# Patient Record
Sex: Male | Born: 1990 | Race: Black or African American | Hispanic: No | Marital: Single | Smoking: Current every day smoker
Health system: Southern US, Community
[De-identification: ages and names within clinical notes are randomized; demographics above are authoritative.]

---

## 2014-11-12 ENCOUNTER — Encounter (HOSPITAL_BASED_OUTPATIENT_CLINIC_OR_DEPARTMENT_OTHER): Payer: Self-pay | Admitting: *Deleted

## 2014-11-12 ENCOUNTER — Emergency Department (HOSPITAL_BASED_OUTPATIENT_CLINIC_OR_DEPARTMENT_OTHER)
Admission: EM | Admit: 2014-11-12 | Discharge: 2014-11-12 | Disposition: A | Discharge status: Home / Self Care | Payer: 59 | Attending: Emergency Medicine | Admitting: Emergency Medicine

## 2014-11-12 DIAGNOSIS — Z720 Tobacco use: Secondary | ICD-10-CM | POA: Insufficient documentation

## 2014-11-12 DIAGNOSIS — Z8619 Personal history of other infectious and parasitic diseases: Secondary | ICD-10-CM | POA: Diagnosis not present

## 2014-11-12 DIAGNOSIS — Z202 Contact with and (suspected) exposure to infections with a predominantly sexual mode of transmission: Secondary | ICD-10-CM | POA: Insufficient documentation

## 2014-11-12 MED ORDER — METRONIDAZOLE 500 MG PO TABS: 500.0000 mg | ORAL_TABLET | Freq: Two times a day (BID) | ORAL | Status: AC

## 2014-11-12 MED ORDER — AZITHROMYCIN 250 MG PO TABS
1000.0000 mg | ORAL_TABLET | Freq: Once | ORAL | Status: AC
  Administered 2014-11-12: 1000 mg via ORAL
  Filled 2014-11-12: qty 4

## 2014-11-12 MED ORDER — CEFTRIAXONE SODIUM 250 MG IJ SOLR
250.0000 mg | Freq: Once | INTRAMUSCULAR | Status: AC
  Administered 2014-11-12: 250 mg via INTRAMUSCULAR
  Filled 2014-11-12: qty 250

## 2014-11-12 NOTE — ED Notes (Signed)
MD at bedside. 

## 2014-11-12 NOTE — ED Provider Notes (Signed)
CSN: [id number]     Arrival date & time 11/12/14  [id number] History   First MD Initiated Contact with Patient 11/12/14 0932     Chief Complaint  Patient presents with  . Exposure to STD     (Consider location/radiation/quality/duration/timing/severity/associated sxs/prior Treatment) HPI Comments: Patient presents wanting to be treated for STDs. He states his male partner was treated for trichomonas about 2 days ago. He's currently denying any symptoms. He denies any penile discharge. He denies any urinary symptoms. He denies abdominal pain. He is sexually active and does not regularly use condoms.  Patient is a 24 y.o. male presenting with STD exposure.  Exposure to STD Pertinent negatives include no chest pain, no abdominal pain, no headaches and no shortness of breath.    History reviewed. No pertinent past medical history. History reviewed. No pertinent past surgical history. No family history on file. History  Substance Use Topics  . Smoking status: Current Every Day Smoker  . Smokeless tobacco: Not on file  . Alcohol Use: No    Review of Systems  Constitutional: Negative for fever, chills, diaphoresis and fatigue.  HENT: Negative for congestion, rhinorrhea and sneezing.   Eyes: Negative.   Respiratory: Negative for cough, chest tightness and shortness of breath.   Cardiovascular: Negative for chest pain and leg swelling.  Gastrointestinal: Negative for nausea, vomiting, abdominal pain, diarrhea and blood in stool.  Genitourinary: Negative for frequency, hematuria, flank pain, discharge, scrotal swelling, difficulty urinating, penile pain and testicular pain.  Musculoskeletal: Negative for back pain and arthralgias.  Skin: Negative for rash.  Neurological: Negative for dizziness, speech difficulty, weakness, numbness and headaches.      Allergies  Review of patient's allergies indicates no known allergies.  Home Medications   Prior to Admission medications   Not on  File   BP 142/86 mmHg  Pulse 69  Temp(Src) 98.3 F (36.8 C) (Oral)  Resp 16  Ht 5\' 4"  (1.626 m)  Wt 150 lb (68.04 kg)  BMI 25.73 kg/m2  SpO2 100% Physical Exam  Constitutional: He is oriented to person, place, and time. He appears well-developed and well-nourished.  HENT:  Head: Normocephalic and atraumatic.  Eyes: Pupils are equal, round, and reactive to light.  Neck: Normal range of motion. Neck supple.  Cardiovascular: Normal rate, regular rhythm and normal heart sounds.   Pulmonary/Chest: Effort normal and breath sounds normal. No respiratory distress. He has no wheezes. He has no rales. He exhibits no tenderness.  Abdominal: Soft. Bowel sounds are normal. There is no tenderness. There is no rebound and no guarding.  Genitourinary: Penis normal.  No penile discharge, no lesions or sores  Musculoskeletal: Normal range of motion. He exhibits no edema.  Lymphadenopathy:    He has no cervical adenopathy.  Neurological: He is alert and oriented to person, place, and time.  Skin: Skin is warm and dry. No rash noted.  Psychiatric: He has a normal mood and affect.    ED Course  Procedures (including critical care time) Labs Review Labs Reviewed  HIV ANTIBODY (ROUTINE TESTING)  RPR    Imaging Review No results found.   EKG Interpretation None      MDM   Final diagnoses:  STD exposure    Patient was treated presumptively with Rocephin and Zithromax. He was given a prescription for Flagyl. He was also tested for HIV and syphilis. Urged to follow-up with the health department or return here for any ongoing concerns.    Rolan BuccoMelanie Ramil Edgington, MD 11/12/14  1017 

## 2014-11-12 NOTE — Discharge Instructions (Signed)
Safe Sex °Safe sex is about reducing the risk of giving or getting a sexually transmitted disease (STD). STDs are spread through sexual contact involving the genitals, mouth, or rectum. Some STDs can be cured and others cannot. Safe sex can also prevent unintended pregnancies.  °WHAT ARE SOME SAFE SEX PRACTICES? °· Limit your sexual activity to only one partner who is having sex with only you. °· Talk to your partner about his or her past partners, past STDs, and drug use. °· Use a condom every time you have sexual intercourse. This includes vaginal, oral, and anal sexual activity. Both females and males should wear condoms during oral sex. Only use latex or polyurethane condoms and water-based lubricants. Using petroleum-based lubricants or oils to lubricate a condom will weaken the condom and increase the chance that it will break. The condom should be in place from the beginning to the end of sexual activity. Wearing a condom reduces, but does not completely eliminate, your risk of getting or giving an STD. STDs can be spread by contact with infected body fluids and skin. °· Get vaccinated for hepatitis B and HPV. °· Avoid alcohol and recreational drugs, which can affect your judgment. You may forget to use a condom or participate in high-risk sex. °· For females, avoid douching after sexual intercourse. Douching can spread an infection farther into the reproductive tract. °· Check your body for signs of sores, blisters, rashes, or unusual discharge. See your health care provider if you notice any of these signs. °· Avoid sexual contact if you have symptoms of an infection or are being treated for an STD. If you or your partner has herpes, avoid sexual contact when blisters are present. Use condoms at all other times. °· If you are at risk of being infected with HIV, it is recommended that you take a prescription medicine daily to prevent HIV infection. This is called pre-exposure prophylaxis (PrEP). You are  considered at risk if: °· You are a man who has sex with other men (MSM). °· You are a heterosexual man or woman who is sexually active with more than one partner. °· You take drugs by injection. °· You are sexually active with a partner who has HIV. °· Talk with your health care provider about whether you are at high risk of being infected with HIV. If you choose to begin PrEP, you should first be tested for HIV. You should then be tested every 3 months for as long as you are taking PrEP. °· See your health care provider for regular screenings, exams, and tests for other STDs. Before having sex with a new partner, each of you should be screened for STDs and should talk about the results with each other. °WHAT ARE THE BENEFITS OF SAFE SEX?  °· There is less chance of getting or giving an STD. °· You can prevent unwanted or unintended pregnancies. °· By discussing safe sex concerns with your partner, you may increase feelings of intimacy, comfort, trust, and honesty between the two of you. °Document Released: 09/13/2004 Document Revised: 12/21/2013 Document Reviewed: 01/28/2012 °ExitCare® Patient Information ©2015 [employer]. This information is not intended to replace advice given to you by your health care provider. Make sure you discuss any questions you have with your health care provider. ° °Trichomoniasis °Trichomoniasis is an infection caused by an organism called Trichomonas. The infection can affect both women and men. In women, the outer male genitalia and the vagina are affected. In men, the penis is   mainly affected, but the prostate and other reproductive organs can also be involved. Trichomoniasis is a sexually transmitted infection (STI) and is most often passed to another person through sexual contact.  RISK FACTORS  Having unprotected sexual intercourse.  Having sexual intercourse with an infected partner. SIGNS AND SYMPTOMS  Symptoms of trichomoniasis in women include:  Abnormal  gray-green frothy vaginal discharge.  Itching and irritation of the vagina.  Itching and irritation of the area outside the vagina. Symptoms of trichomoniasis in men include:   Penile discharge with or without pain.  Pain during urination. This results from inflammation of the urethra. DIAGNOSIS  Trichomoniasis may be found during a Pap test or physical exam. Your health care provider may use one of the following methods to help diagnose this infection:  Examining vaginal discharge under a microscope. For men, urethral discharge would be examined.  Testing the pH of the vagina with a test tape.  Using a vaginal swab test that checks for the Trichomonas organism. A test is available that provides results within a few minutes.  Doing a culture test for the organism. This is not usually needed. TREATMENT   You may be given medicine to fight the infection. Women should inform their health care provider if they could be or are pregnant. Some medicines used to treat the infection should not be taken during pregnancy.  Your health care provider may recommend over-the-counter medicines or creams to decrease itching or irritation.  Your sexual partner will need to be treated if infected. HOME CARE INSTRUCTIONS   Take medicines only as directed by your health care provider.  Take over-the-counter medicine for itching or irritation as directed by your health care provider.  Do not have sexual intercourse while you have the infection.  Women should not douche or wear tampons while they have the infection.  Discuss your infection with your partner. Your partner may have gotten the infection from you, or you may have gotten it from your partner.  Have your sex partner get examined and treated if necessary.  Practice safe, informed, and protected sex.  See your health care provider for other STI testing. SEEK MEDICAL CARE IF:   You still have symptoms after you finish your  medicine.  You develop abdominal pain.  You have pain when you urinate.  You have bleeding after sexual intercourse.  You develop a rash.  Your medicine makes you sick or makes you throw up (vomit). MAKE SURE YOU:  Understand these instructions.  Will watch your condition.  Will get help right away if you are not doing well or get worse. Document Released: 01/30/2001 Document Revised: 12/21/2013 Document Reviewed: 05/18/2013 [facility] Patient Information 2015 [employer], [place]. This information is not intended to replace advice given to you by your health care provider. Make sure you discuss any questions you have with your health care provider.

## 2014-11-12 NOTE — ED Notes (Signed)
Pt's partner tested positive for trichomonas a couple of days ago. Pt denies any symptoms but would like to be tested/tx.

## 2014-11-13 LAB — RPR: RPR Ser Ql: NONREACTIVE

## 2014-11-13 LAB — HIV ANTIBODY (ROUTINE TESTING W REFLEX): HIV SCREEN 4TH GENERATION: NONREACTIVE

## 2014-12-23 ENCOUNTER — Encounter (HOSPITAL_BASED_OUTPATIENT_CLINIC_OR_DEPARTMENT_OTHER): Payer: Self-pay | Admitting: *Deleted

## 2014-12-23 ENCOUNTER — Emergency Department (HOSPITAL_BASED_OUTPATIENT_CLINIC_OR_DEPARTMENT_OTHER)
Admission: EM | Admit: 2014-12-23 | Discharge: 2014-12-23 | Disposition: A | Discharge status: Home / Self Care | Payer: 59 | Attending: Emergency Medicine | Admitting: Emergency Medicine

## 2014-12-23 DIAGNOSIS — Z720 Tobacco use: Secondary | ICD-10-CM | POA: Insufficient documentation

## 2014-12-23 DIAGNOSIS — Y998 Other external cause status: Secondary | ICD-10-CM | POA: Insufficient documentation

## 2014-12-23 DIAGNOSIS — Y92410 Unspecified street and highway as the place of occurrence of the external cause: Secondary | ICD-10-CM | POA: Diagnosis not present

## 2014-12-23 DIAGNOSIS — Y9389 Activity, other specified: Secondary | ICD-10-CM | POA: Insufficient documentation

## 2014-12-23 DIAGNOSIS — S3991XA Unspecified injury of abdomen, initial encounter: Secondary | ICD-10-CM | POA: Insufficient documentation

## 2014-12-23 DIAGNOSIS — S161XXA Strain of muscle, fascia and tendon at neck level, initial encounter: Secondary | ICD-10-CM | POA: Insufficient documentation

## 2014-12-23 DIAGNOSIS — S199XXA Unspecified injury of neck, initial encounter: Secondary | ICD-10-CM | POA: Diagnosis present

## 2014-12-23 DIAGNOSIS — S39012A Strain of muscle, fascia and tendon of lower back, initial encounter: Secondary | ICD-10-CM | POA: Diagnosis not present

## 2014-12-23 MED ORDER — NAPROXEN 500 MG PO TABS: 500.0000 mg | ORAL_TABLET | Freq: Two times a day (BID) | ORAL | Status: AC
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
## 2014-12-23 NOTE — Discharge Instructions (Signed)
Expect to be sore and stiff for the next few days. Take the Naprosyn as directed. Work note provided to be out of work for 2 days.
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
## 2015-01-13 ENCOUNTER — Encounter (HOSPITAL_BASED_OUTPATIENT_CLINIC_OR_DEPARTMENT_OTHER): Payer: Self-pay | Admitting: *Deleted

## 2015-01-13 ENCOUNTER — Emergency Department (HOSPITAL_BASED_OUTPATIENT_CLINIC_OR_DEPARTMENT_OTHER)
Admission: EM | Admit: 2015-01-13 | Discharge: 2015-01-13 | Disposition: A | Discharge status: Home / Self Care | Payer: 59 | Attending: Emergency Medicine | Admitting: Emergency Medicine

## 2015-01-13 ENCOUNTER — Emergency Department (HOSPITAL_BASED_OUTPATIENT_CLINIC_OR_DEPARTMENT_OTHER): Payer: 59

## 2015-01-13 DIAGNOSIS — Z720 Tobacco use: Secondary | ICD-10-CM | POA: Insufficient documentation

## 2015-01-13 DIAGNOSIS — M545 Low back pain, unspecified: Secondary | ICD-10-CM

## 2015-01-13 DIAGNOSIS — Z791 Long term (current) use of non-steroidal anti-inflammatories (NSAID): Secondary | ICD-10-CM | POA: Insufficient documentation

## 2015-01-13 MED ORDER — CYCLOBENZAPRINE HCL 10 MG PO TABS: 10.0000 mg | ORAL_TABLET | Freq: Two times a day (BID) | ORAL | Status: AC | PRN

## 2015-01-13 MED ORDER — IBUPROFEN 800 MG PO TABS
800.0000 mg | ORAL_TABLET | Freq: Once | ORAL | Status: AC
  Administered 2015-01-13: 800 mg via ORAL
  Filled 2015-01-13: qty 1

## 2015-01-13 MED ORDER — IBUPROFEN 800 MG PO TABS: 800.0000 mg | ORAL_TABLET | Freq: Three times a day (TID) | ORAL | Status: AC
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
## 2015-01-13 NOTE — Discharge Instructions (Signed)
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
## 2015-11-26 ENCOUNTER — Emergency Department (HOSPITAL_BASED_OUTPATIENT_CLINIC_OR_DEPARTMENT_OTHER): Payer: No Typology Code available for payment source

## 2015-11-26 ENCOUNTER — Emergency Department (HOSPITAL_BASED_OUTPATIENT_CLINIC_OR_DEPARTMENT_OTHER)
Admission: EM | Admit: 2015-11-26 | Discharge: 2015-11-26 | Disposition: A | Discharge status: Home / Self Care | Payer: No Typology Code available for payment source | Attending: Emergency Medicine | Admitting: Emergency Medicine

## 2015-11-26 ENCOUNTER — Encounter (HOSPITAL_BASED_OUTPATIENT_CLINIC_OR_DEPARTMENT_OTHER): Payer: Self-pay | Admitting: *Deleted

## 2015-11-26 DIAGNOSIS — F17210 Nicotine dependence, cigarettes, uncomplicated: Secondary | ICD-10-CM | POA: Insufficient documentation

## 2015-11-26 DIAGNOSIS — J069 Acute upper respiratory infection, unspecified: Secondary | ICD-10-CM | POA: Insufficient documentation

## 2015-11-26 LAB — RAPID STREP SCREEN (MED CTR MEBANE ONLY): STREPTOCOCCUS, GROUP A SCREEN (DIRECT): NEGATIVE

## 2015-11-26 NOTE — ED Notes (Signed)
Patient c/o sore throat/productive cough since yesterday

## 2015-11-26 NOTE — Discharge Instructions (Signed)
Upper Respiratory Infection, Adult Most upper respiratory infections (URIs) are a viral infection of the air passages leading to the lungs. A URI affects the nose, throat, and upper air passages. The most common type of URI is nasopharyngitis and is typically referred to as "the common cold." URIs run their course and usually go away on their own. Most of the time, a URI does not require medical attention, but sometimes a bacterial infection in the upper airways can follow a viral infection. This is called a secondary infection. Sinus and middle ear infections are common types of secondary upper respiratory infections. Bacterial pneumonia can also complicate a URI. A URI can worsen asthma and chronic obstructive pulmonary disease (COPD). Sometimes, these complications can require emergency medical care and may be life threatening.  CAUSES Almost all URIs are caused by viruses. A virus is a type of germ and can spread from one person to another.  RISKS FACTORS You may be at risk for a URI if:   You smoke.   You have chronic heart or lung disease.  You have a weakened defense (immune) system.   You are very young or very old.   You have nasal allergies or asthma.  You work in crowded or poorly ventilated areas.  You work in health care facilities or schools. SIGNS AND SYMPTOMS  Symptoms typically develop 2-3 days after you come in contact with a cold virus. Most viral URIs last 7-10 days. However, viral URIs from the influenza virus (flu virus) can last 14-18 days and are typically more severe. Symptoms may include:   Runny or stuffy (congested) nose.   Sneezing.   Cough.   Sore throat.   Headache.   Fatigue.   Fever.   Loss of appetite.   Pain in your forehead, behind your eyes, and over your cheekbones (sinus pain).  Muscle aches.  DIAGNOSIS  Your health care provider may diagnose a URI by:  Physical exam.  Tests to check that your symptoms are not due to  another condition such as:  Strep throat.  Sinusitis.  Pneumonia.  Asthma. TREATMENT  A URI goes away on its own with time. It cannot be cured with medicines, but medicines may be prescribed or recommended to relieve symptoms. Medicines may help:  Reduce your fever.  Reduce your cough.  Relieve nasal congestion. HOME CARE INSTRUCTIONS   Take medicines only as directed by your health care provider.   Gargle warm saltwater or take cough drops to comfort your throat as directed by your health care provider.  Use a warm mist humidifier or inhale steam from a shower to increase air moisture. This may make it easier to breathe.  Drink enough fluid to keep your urine clear or pale yellow.   Eat soups and other clear broths and maintain good nutrition.   Rest as needed.   Return to work when your temperature has returned to normal or as your health care provider advises. You may need to stay home longer to avoid infecting others. You can also use a face mask and careful hand washing to prevent spread of the virus.  Increase the usage of your inhaler if you have asthma.   Do not use any tobacco products, including cigarettes, chewing tobacco, or electronic cigarettes. If you need help quitting, ask your health care provider. PREVENTION  The best way to protect yourself from getting a cold is to practice good hygiene.   Avoid oral or hand contact with people with cold   symptoms.   Wash your hands often if contact occurs.  There is no clear evidence that vitamin C, vitamin E, echinacea, or exercise reduces the chance of developing a cold. However, it is always recommended to get plenty of rest, exercise, and practice good nutrition.  SEEK MEDICAL CARE IF:   You are getting worse rather than better.   Your symptoms are not controlled by medicine.   You have chills.  You have worsening shortness of breath.  You have brown or red mucus.  You have yellow or brown nasal  discharge.  You have pain in your face, especially when you bend forward.  You have a fever.  You have swollen neck glands.  You have pain while swallowing.  You have white areas in the back of your throat. SEEK IMMEDIATE MEDICAL CARE IF:   You have severe or persistent:  Headache.  Ear pain.  Sinus pain.  Chest pain.  You have chronic lung disease and any of the following:  Wheezing.  Prolonged cough.  Coughing up blood.  A change in your usual mucus.  You have a stiff neck.  You have changes in your:  Vision.  Hearing.  Thinking.  Mood. MAKE SURE YOU:   Understand these instructions.  Will watch your condition.  Will get help right away if you are not doing well or get worse.   This information is not intended to replace advice given to you by your health care provider. Make sure you discuss any questions you have with your health care provider.   Document Released: 01/30/2001 Document Revised: 12/21/2014 Document Reviewed: 11/11/2013 Elsevier Interactive Patient Education 2016 [employer]  

## 2015-11-26 NOTE — ED Provider Notes (Signed)
CSN: [id number]     Arrival date & time 11/26/15  1216 History   First MD Initiated Contact with Patient 11/26/15 1330     Chief Complaint  Patient presents with  . Sore Throat  . Cough   HPI  Javier Jackson is a 25 year old male presenting with sore throat and cough. Onset of symptoms was yesterday. He is complaining of a cough productive of green-brown sputum. He states that he gets mildly short of breath after his coughing episodes but this resolves within 10-15 seconds. He denies associated chest pain. He is at his throat feels sore and sensation is only present when swallowing. He denies difficulty handling secretions, swallowing or breathing. He has not taken any over-the-counter medications. He denies known sick contacts. He states that he washed his hair yesterday and went out in the cold which he believes caused these symptoms. Denies fevers, chills, headaches, ear pain, ear fullness, nasal congestion, rhinorrhea, abdominal pain, nausea or vomiting.  History reviewed. No pertinent past medical history. No past surgical history on file. No family history on file. Social History  Substance Use Topics  . Smoking status: Current Every Day Smoker -- 0.50 packs/day    Types: Cigarettes  . Smokeless tobacco: None  . Alcohol Use: No    Review of Systems  All other systems reviewed and are negative.     Allergies  Review of patient's allergies indicates no known allergies.  Home Medications   Prior to Admission medications   Medication Sig Start Date End Date Taking? Authorizing Provider  cyclobenzaprine (FLEXERIL) 10 MG tablet Take 1 tablet (10 mg total) by mouth 2 (two) times daily as needed for muscle spasms. 01/13/15   Glynn OctaveStephen Rancour, MD  ibuprofen (ADVIL,MOTRIN) 800 MG tablet Take 1 tablet (800 mg total) by mouth 3 (three) times daily. 01/13/15   Glynn OctaveStephen Rancour, MD  metroNIDAZOLE (FLAGYL) 500 MG tablet Take 1 tablet (500 mg total) by mouth 2 (two) times daily. One po bid x 7  days 11/12/14   Rolan BuccoMelanie Belfi, MD  naproxen (NAPROSYN) 500 MG tablet Take 1 tablet (500 mg total) by mouth 2 (two) times daily. 12/23/14   Vanetta MuldersScott Zackowski, MD   BP 128/71 mmHg  Pulse 54  Temp(Src) 99.3 F (37.4 C) (Oral)  Resp 16  Ht 5\' 10"  (1.778 m)  Wt 58.968 kg  BMI 18.65 kg/m2  SpO2 100% Physical Exam  Constitutional: He appears well-developed and well-nourished. No distress.  Nontoxic-appearing  HENT:  Head: Normocephalic and atraumatic.  Right Ear: Tympanic membrane and ear canal normal.  Left Ear: Tympanic membrane and ear canal normal.  Nose: Nose normal. Right sinus exhibits no maxillary sinus tenderness and no frontal sinus tenderness. Left sinus exhibits no maxillary sinus tenderness and no frontal sinus tenderness.  Mouth/Throat: Uvula is midline, oropharynx is clear and moist and mucous membranes are normal. No oropharyngeal exudate, posterior oropharyngeal edema or posterior oropharyngeal erythema.  Eyes: Conjunctivae are normal. Right eye exhibits no discharge. Left eye exhibits no discharge. No scleral icterus.  Neck: Normal range of motion. Neck supple.  Cardiovascular: Normal rate, regular rhythm and normal heart sounds.   Pulmonary/Chest: Effort normal and breath sounds normal. No respiratory distress. He has no wheezes. He has no rales.  Abdominal: Soft. He exhibits no distension. There is no tenderness.  Musculoskeletal: Normal range of motion.  Lymphadenopathy:    He has no cervical adenopathy.  Neurological: He is alert. Coordination normal.  Skin: Skin is warm and dry.  Psychiatric: He has  a normal mood and affect. His behavior is normal.  Nursing note and vitals reviewed.   ED Course  Procedures (including critical care time) Labs Review Labs Reviewed  RAPID STREP SCREEN (NOT AT [facility])  CULTURE, GROUP A STREP [facility])    Imaging Review Dg Chest 2 View  11/26/2015  CLINICAL DATA:  25 year old presenting with 2 day history of cough, chest congestion and  sore throat. Current smoker. EXAM: CHEST  2 VIEW COMPARISON:  None. FINDINGS: Cardiomediastinal silhouette unremarkable. Lungs clear. Bronchovascular markings normal. Pulmonary vascularity normal. No pneumothorax. No pleural effusions. Visualized bony thorax intact. IMPRESSION: Normal examination. Electronically Signed   By: Hulan Saas M.D.   On: 11/26/2015 14:21   I have personally reviewed and evaluated these images and lab results as part of my medical decision-making.   EKG Interpretation None      MDM   Final diagnoses:  URI (upper respiratory infection)   Patient presenting with cough and sore throat x 1 day. VSS. Pt is nontoxic appearing. No nasal musosal edema noted. TMs pearly gray without erythema or effusion. Oropharynx without erythema, edema or exudate. Lungs CTAB. CXR negative for acute infiltrate. Rapid strep negative. Patients symptoms are consistent with URI, likely viral etiology. Discussed that antibiotics are not indicated for viral infections. Pt will be discharged with symptomatic treatment. Verbalizes understanding and is agreeable with plan. Pt is hemodynamically stable & in NAD prior to dc.     Rolm Gala Brinson Tozzi, PA-C 11/26/15 1435  Rolan Bucco, MD 11/26/15 1438

## 2015-11-27 LAB — CULTURE, GROUP A STREP (THRC)

## 2017-02-10 IMAGING — CR DG CHEST 2V
2 series · 2 of 2 positions shown · non-contrast
Comparison: None.

CLINICAL DATA: 25-year-old presenting with 2 day history of cough,
chest congestion and sore throat. Current smoker.

EXAM:
CHEST  2 VIEW

[w chest pa]
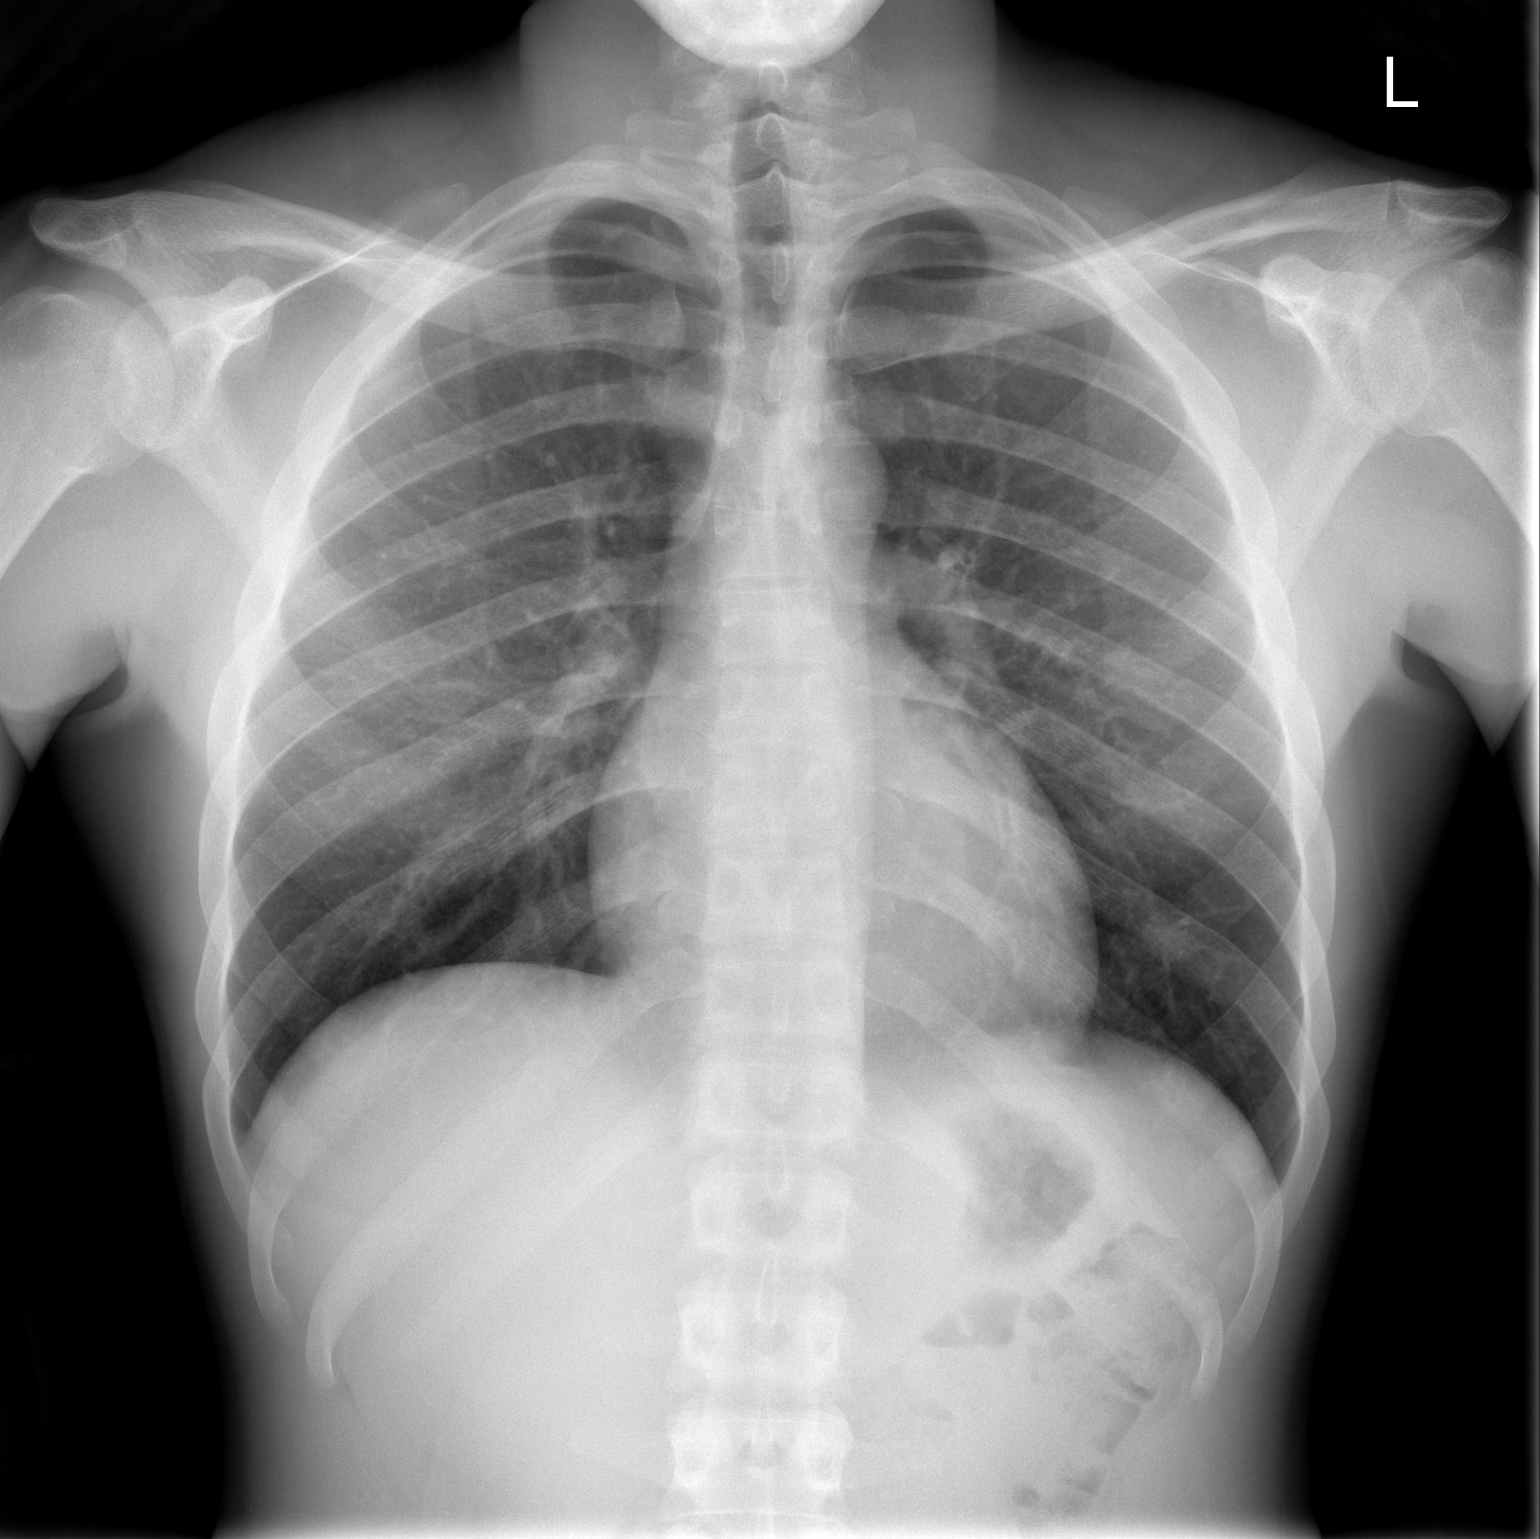

[w chest lat]
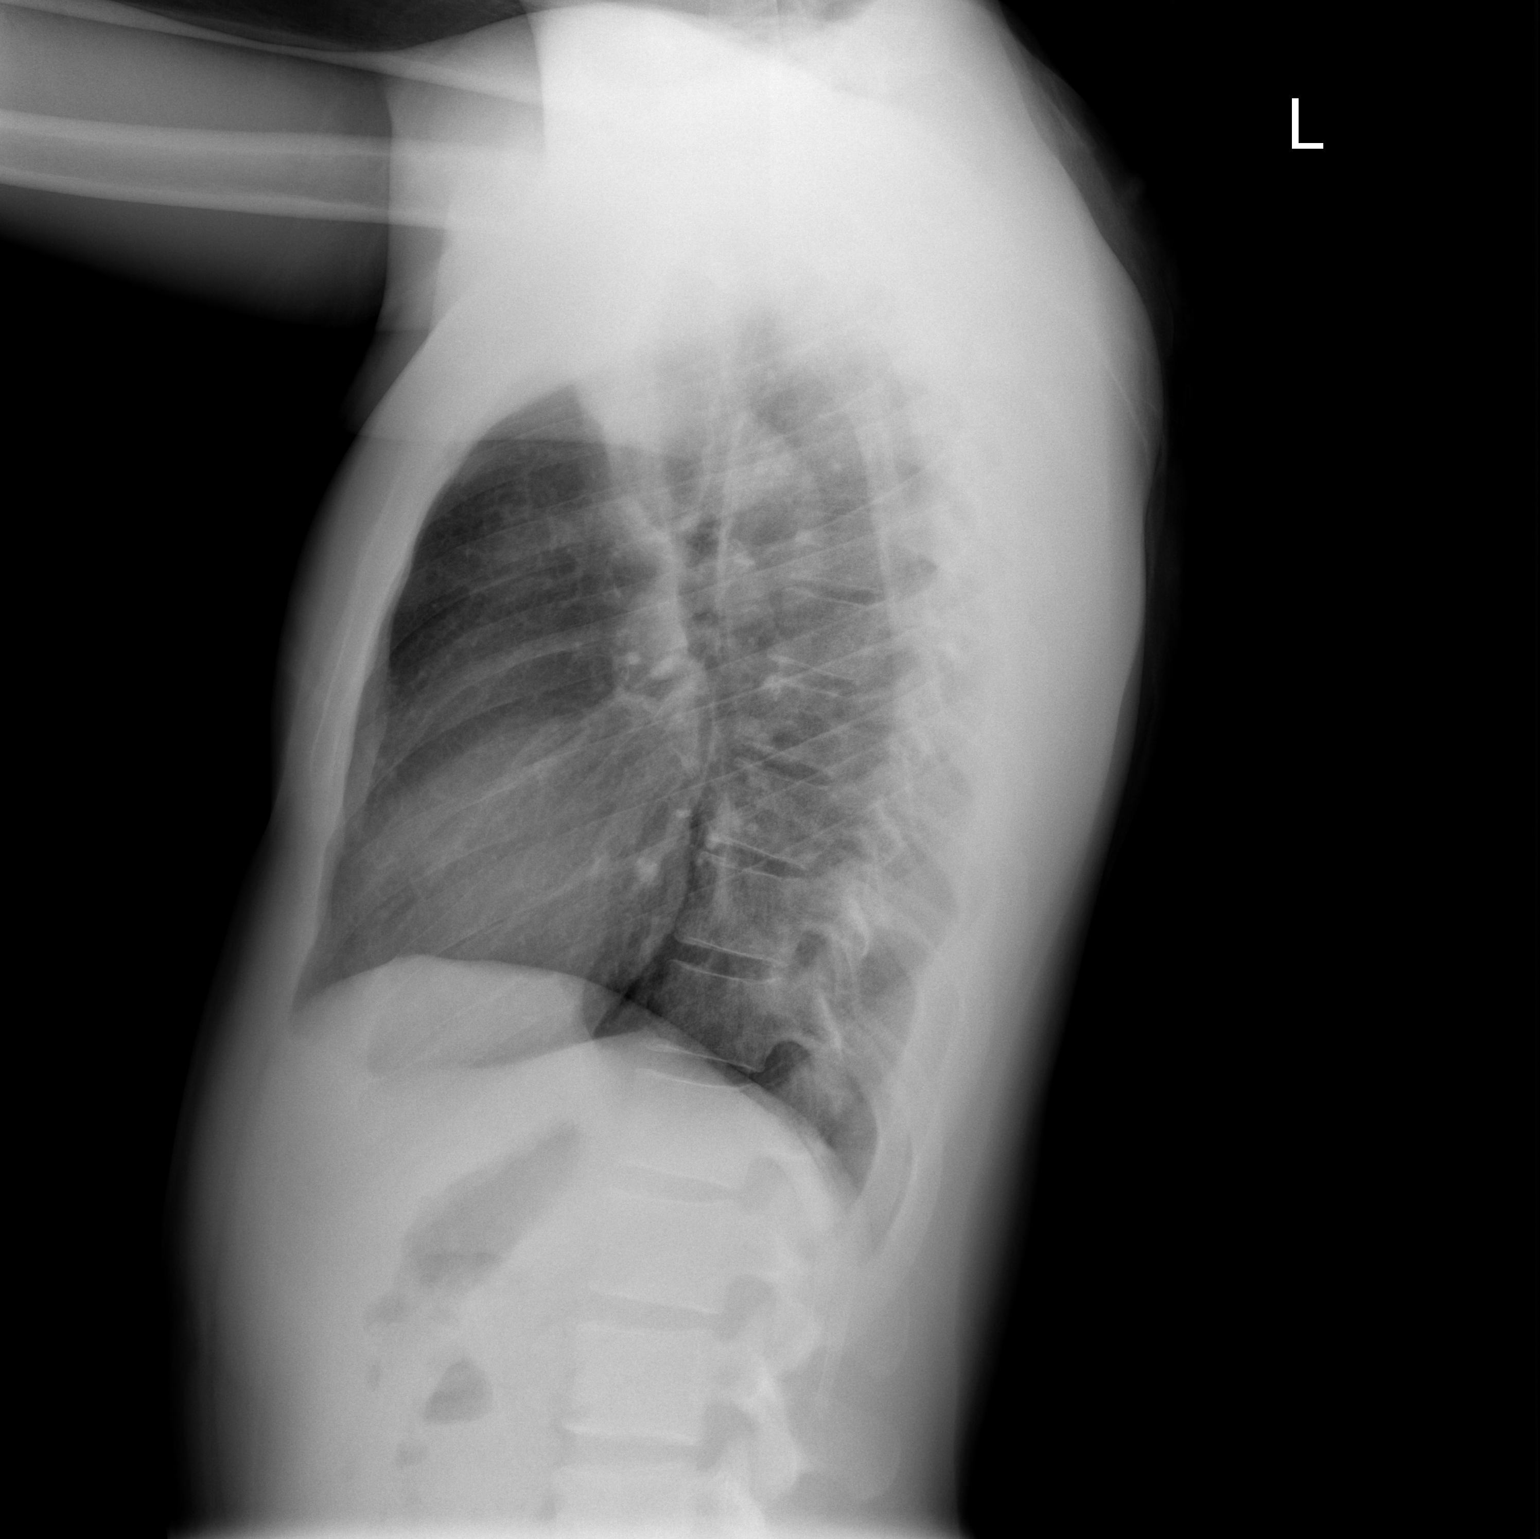

[2 of 2 positions shown; findings below may reference images not displayed]

FINDINGS: Cardiomediastinal silhouette unremarkable. Lungs clear.
Bronchovascular markings normal. Pulmonary vascularity normal. No
pneumothorax. No pleural effusions. Visualized bony thorax intact.
IMPRESSION: Normal examination.
